# Patient Record
Sex: Female | Born: 1980 | Hispanic: Yes | Marital: Married | Smoking: Never smoker
Health system: Southern US, Community
[De-identification: ages and names within clinical notes are randomized; demographics above are authoritative.]

---

## 2020-09-13 ENCOUNTER — Emergency Department (HOSPITAL_BASED_OUTPATIENT_CLINIC_OR_DEPARTMENT_OTHER)
Admission: EM | Admit: 2020-09-13 | Discharge: 2020-09-13 | Disposition: A | Discharge status: Home / Self Care | Attending: Emergency Medicine | Admitting: Emergency Medicine

## 2020-09-13 ENCOUNTER — Emergency Department (HOSPITAL_BASED_OUTPATIENT_CLINIC_OR_DEPARTMENT_OTHER)

## 2020-09-13 ENCOUNTER — Other Ambulatory Visit: Payer: Self-pay

## 2020-09-13 ENCOUNTER — Encounter (HOSPITAL_BASED_OUTPATIENT_CLINIC_OR_DEPARTMENT_OTHER): Payer: Self-pay | Admitting: Urology

## 2020-09-13 DIAGNOSIS — R079 Chest pain, unspecified: Secondary | ICD-10-CM | POA: Diagnosis present

## 2020-09-13 LAB — BASIC METABOLIC PANEL
Anion gap: 8 (ref 5–15)
BUN: 12 mg/dL (ref 6–20)
CO2: 26 mmol/L (ref 22–32)
Calcium: 9 mg/dL (ref 8.9–10.3)
Chloride: 102 mmol/L (ref 98–111)
Creatinine, Ser: 0.57 mg/dL (ref 0.44–1.00)
GFR, Estimated: >60 mL/min (ref >60)
Glucose, Bld: 92 mg/dL (ref 70–99)
Potassium: 3.4 mmol/L — ABNORMAL LOW (ref 3.5–5.1)
Sodium: 136 mmol/L (ref 135–145)

## 2020-09-13 LAB — CBC
HCT: 32.7 % — ABNORMAL LOW (ref 36.0–46.0)
Hemoglobin: 10.2 g/dL — ABNORMAL LOW (ref 12.0–15.0)
MCH: 22 pg — ABNORMAL LOW (ref 26.0–34.0)
MCHC: 31.2 g/dL (ref 30.0–36.0)
MCV: 70.6 fL — ABNORMAL LOW (ref 80.0–100.0)
Platelets: 391 10*3/uL (ref 150–400)
RBC: 4.63 MIL/uL (ref 3.87–5.11)
RDW: 17.6 % — ABNORMAL HIGH (ref 11.5–15.5)
WBC: 11.8 10*3/uL — ABNORMAL HIGH (ref 4.0–10.5)
nRBC: 0 % (ref 0.0–0.2)

## 2020-09-13 LAB — TROPONIN I (HIGH SENSITIVITY): Troponin I (High Sensitivity): <2 ng/L (ref <18)

## 2020-09-13 MED ORDER — ASPIRIN 81 MG PO CHEW
324.0000 mg | CHEWABLE_TABLET | Freq: Once | ORAL | Status: AC
  Administered 2020-09-13: 324 mg via ORAL
  Filled 2020-09-13: qty 4

## 2020-09-13 MED ORDER — NAPROXEN 375 MG PO TABS: 375.0000 mg | ORAL_TABLET | Freq: Two times a day (BID) | ORAL | 0 refills | Status: AC

## 2020-09-13 NOTE — Discharge Instructions (Addendum)
Take the medications to help with the pain and discomfort.  Follow-up with a primary care doctor next week to be rechecked if the symptoms persist.  Return as needed for worsening symptoms

## 2020-09-13 NOTE — ED Provider Notes (Signed)
[facility] EMERGENCY DEPARTMENT Provider Note   CSN: [id number] Arrival date & time: 09/13/20  1638     History Chief Complaint  Patient presents with   Chest Pain    Donna Miller is a 40 y.o. female.   Chest Pain  HPI: A 40 year old patient presents for evaluation of chest pain. Initial onset of pain was approximately 3-6 hours ago. The patient's chest pain is described as heaviness/pressure/tightness and is not worse with exertion. The patient's chest pain is middle- or left-sided, is not well-localized, is not sharp and does not radiate to the arms/jaw/neck. The patient does not complain of nausea and denies diaphoresis. The patient has no history of stroke, has no history of peripheral artery disease, has not smoked in the past 90 days, denies any history of treated diabetes, has no relevant family history of coronary artery disease (first degree relative at less than age 46), is not hypertensive, has no history of hypercholesterolemia and does not have an elevated BMI (>=30).   History reviewed. No pertinent past medical history.  There are no problems to display for this patient.   Past Surgical History:  Procedure Laterality Date   CESAREAN SECTION       OB History   No obstetric history on file.     History reviewed. No pertinent family history.  Social History   Tobacco Use   Smoking status: Never   Smokeless tobacco: Never  Substance Use Topics   Alcohol use: Yes    Comment: socially   Drug use: Never    Home Medications Prior to Admission medications   Medication Sig Start Date End Date Taking? Authorizing Provider  naproxen (NAPROSYN) 375 MG tablet Take 1 tablet (375 mg total) by mouth 2 (two) times daily. 09/13/20  Yes Linwood Dibbles, MD    Allergies    Patient has no allergy information on record.  Review of Systems   Review of Systems  Cardiovascular:  Positive for chest pain.  All other systems reviewed and are  negative.  Physical Exam Updated Vital Signs BP 110/77   Pulse 79   Temp 98.3 F (36.8 C) (Oral)   Resp 12   Ht 1.499 m (4\' 11" )   Wt 68 kg   SpO2 100%   BMI 30.30 kg/m   Physical Exam Vitals and nursing note reviewed.  Constitutional:      General: She is not in acute distress.    Appearance: She is well-developed.  HENT:     Head: Normocephalic and atraumatic.     Right Ear: External ear normal.     Left Ear: External ear normal.  Eyes:     General: No scleral icterus.       Right eye: No discharge.        Left eye: No discharge.     Conjunctiva/sclera: Conjunctivae normal.  Neck:     Trachea: No tracheal deviation.  Cardiovascular:     Rate and Rhythm: Normal rate and regular rhythm.  Pulmonary:     Effort: Pulmonary effort is normal. No respiratory distress.     Breath sounds: Normal breath sounds. No stridor. No wheezing or rales.  Abdominal:     General: Bowel sounds are normal. There is no distension.     Palpations: Abdomen is soft.     Tenderness: There is no abdominal tenderness. There is no guarding or rebound.  Musculoskeletal:        General: No tenderness or deformity.  Cervical back: Neck supple.  Skin:    General: Skin is warm and dry.     Findings: No rash.  Neurological:     General: No focal deficit present.     Mental Status: She is alert.     Cranial Nerves: No cranial nerve deficit (no facial droop, extraocular movements intact, no slurred speech).     Sensory: No sensory deficit.     Motor: No abnormal muscle tone or seizure activity.     Coordination: Coordination normal.  Psychiatric:        Mood and Affect: Mood normal.    ED Results / Procedures / Treatments   Labs (all labs ordered are listed, but only abnormal results are displayed) Labs Reviewed  BASIC METABOLIC PANEL - Abnormal; Notable for the following components:      Result Value   Potassium 3.4 (*)    All other components within normal limits  CBC - Abnormal;  Notable for the following components:   WBC 11.8 (*)    Hemoglobin 10.2 (*)    HCT 32.7 (*)    MCV 70.6 (*)    MCH 22.0 (*)    RDW 17.6 (*)    All other components within normal limits  TROPONIN I (HIGH SENSITIVITY)  TROPONIN I (HIGH SENSITIVITY)    EKG EKG Interpretation  Date/Time:  Monday September 13 2020 17:02:07 EDT Ventricular Rate:  89 PR Interval:  146 QRS Duration: 80 QT Interval:  378 QTC Calculation: 459 R Axis:   16 Text Interpretation: Normal sinus rhythm Normal ECG No old tracing to compare Confirmed by Linwood Dibbles [phone]) on 09/13/2020 5:10:50 PM  Radiology DG Chest Portable 1 View  Result Date: 09/13/2020 CLINICAL DATA:  Chest pain EXAM: PORTABLE CHEST 1 VIEW COMPARISON:  None. FINDINGS: The heart size and mediastinal contours are within normal limits. Both lungs are clear. The visualized skeletal structures are unremarkable. IMPRESSION: No evidence of acute cardiopulmonary disease. Electronically Signed   By: Caprice Renshaw   On: 09/13/2020 20:13    Procedures Procedures   Medications Ordered in ED Medications  aspirin chewable tablet 324 mg (324 mg Oral Given 09/13/20 2012)    ED Course  I have reviewed the triage vital signs and the nursing notes.  Pertinent labs & imaging results that were available during my care of the patient were reviewed by me and considered in my medical decision making (see chart for details).    MDM Rules/Calculators/A&P HEAR Score: 1                         Patient presented to the ED for evaluation of chest pain.  Patient is low risk for heart disease.  EKG and serial troponins are reassuring.  Doubt acute coronary syndrome.  No signs of pneumonia, pneumothorax or pleural effusion on chest x-ray.  Low risk for PE.  PERC negative.  Doubt pulmonary embolism.  We will try a course of NSAIDs.  Outpatient follow-up with PCP.  Warning signs precautions discussed. Final Clinical Impression(s) / ED Diagnoses Final diagnoses:  Nonspecific  chest pain    Rx / DC Orders ED Discharge Orders          Ordered    naproxen (NAPROSYN) 375 MG tablet  2 times daily        09/13/20 2103             Linwood Dibbles, MD 09/13/20 2104

## 2020-09-13 NOTE — ED Triage Notes (Signed)
Left sided chest pain since1400, intermittent, states high blood pressure at work/UC 140/99 at [facility].

## 2022-07-24 IMAGING — DX DG CHEST 1V PORT
1 series · 1 of 1 positions shown · non-contrast
Comparison: None.

CLINICAL DATA: Chest pain

EXAM:
PORTABLE CHEST 1 VIEW

[chest ap]
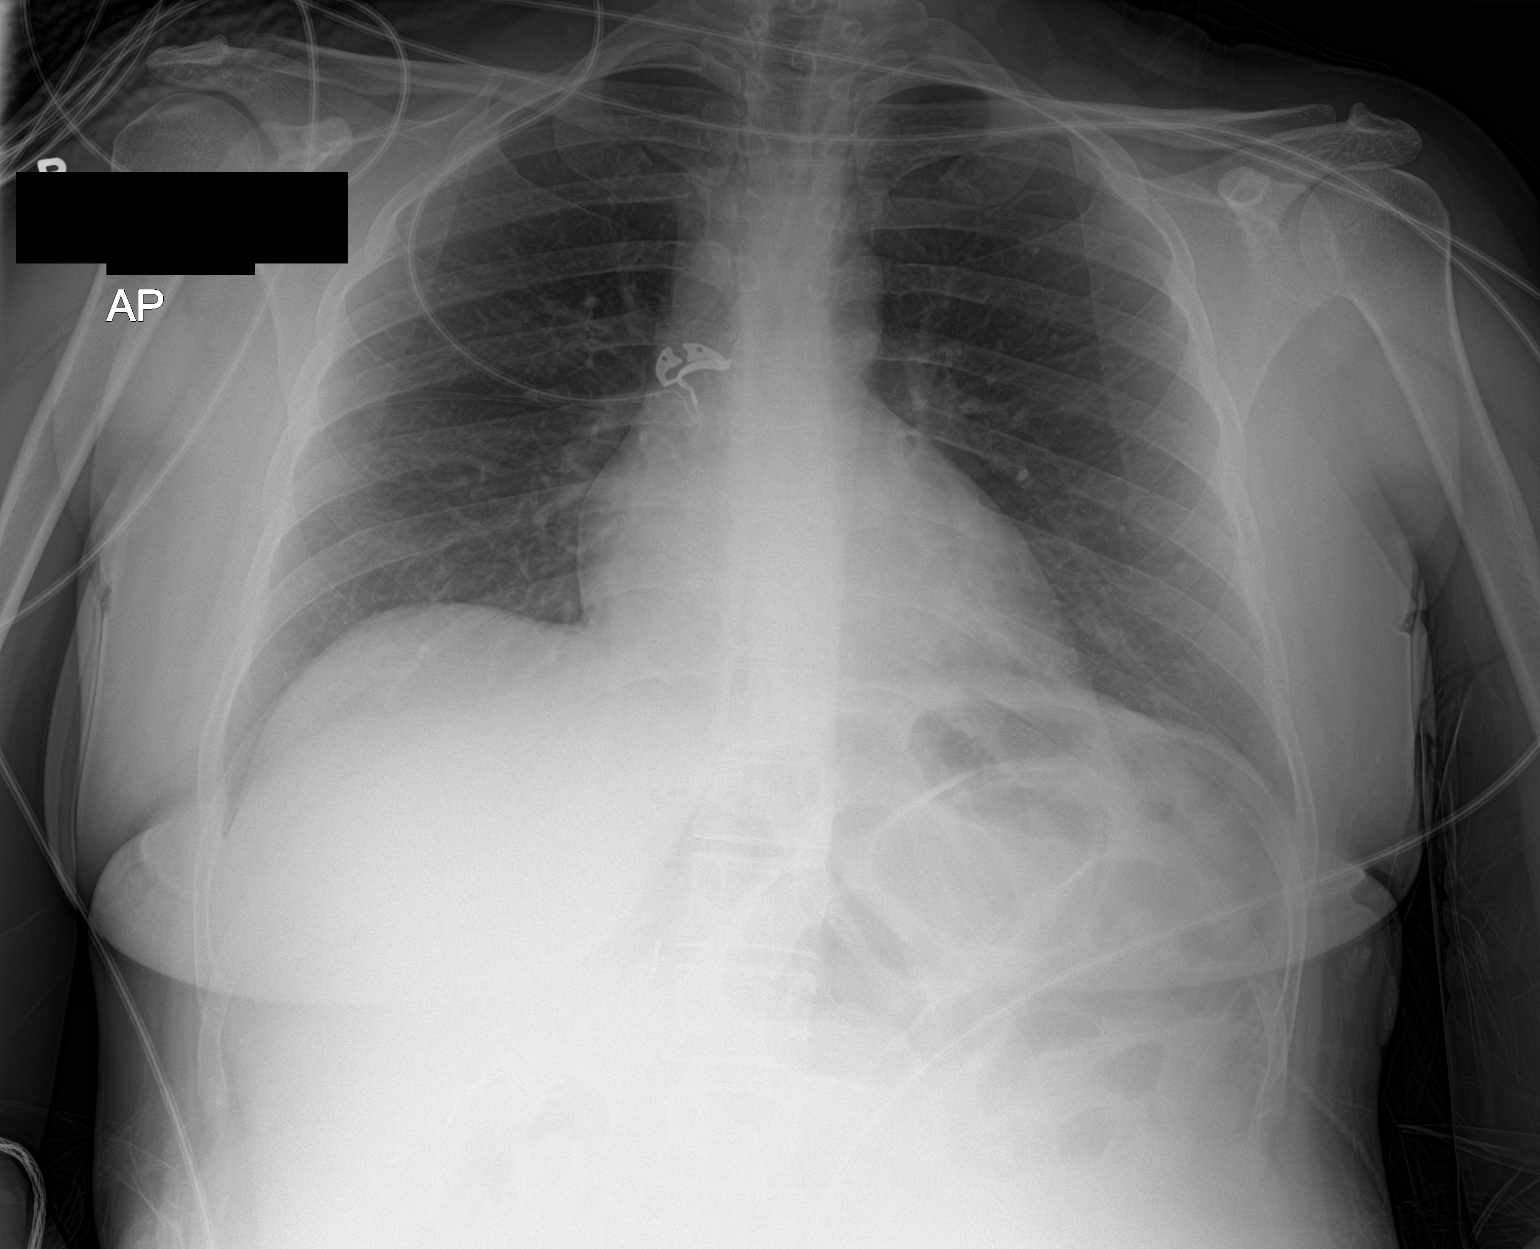

[1 of 1 positions shown; findings below may reference images not displayed]

FINDINGS: The heart size and mediastinal contours are within normal limits.
Both lungs are clear. The visualized skeletal structures are
unremarkable.
IMPRESSION: No evidence of acute cardiopulmonary disease.
